# Patient Record
Sex: Male | Born: 1975 | Race: Black or African American | Hispanic: No | Marital: Married | Smoking: Never smoker
Health system: Southern US, Community
[De-identification: ages and names within clinical notes are randomized; demographics above are authoritative.]

---

## 2017-04-04 ENCOUNTER — Other Ambulatory Visit: Payer: Self-pay

## 2017-04-04 ENCOUNTER — Emergency Department (HOSPITAL_COMMUNITY)
Admission: EM | Admit: 2017-04-04 | Discharge: 2017-04-04 | Disposition: A | Discharge status: Home / Self Care | Payer: BLUE CROSS/BLUE SHIELD | Attending: Emergency Medicine | Admitting: Emergency Medicine

## 2017-04-04 ENCOUNTER — Emergency Department (HOSPITAL_COMMUNITY): Payer: BLUE CROSS/BLUE SHIELD

## 2017-04-04 ENCOUNTER — Encounter (HOSPITAL_COMMUNITY): Payer: Self-pay

## 2017-04-04 DIAGNOSIS — Z79899 Other long term (current) drug therapy: Secondary | ICD-10-CM | POA: Insufficient documentation

## 2017-04-04 DIAGNOSIS — H81393 Other peripheral vertigo, bilateral: Secondary | ICD-10-CM | POA: Insufficient documentation

## 2017-04-04 DIAGNOSIS — R42 Dizziness and giddiness: Secondary | ICD-10-CM | POA: Diagnosis present

## 2017-04-04 DIAGNOSIS — H81399 Other peripheral vertigo, unspecified ear: Secondary | ICD-10-CM

## 2017-04-04 LAB — CBC
HEMATOCRIT: 46.9 % (ref 39.0–52.0)
HEMOGLOBIN: 16.2 g/dL (ref 13.0–17.0)
MCH: 31.7 pg (ref 26.0–34.0)
MCHC: 34.5 g/dL (ref 30.0–36.0)
MCV: 91.8 fL (ref 78.0–100.0)
Platelets: 249 10*3/uL (ref 150–400)
RBC: 5.11 MIL/uL (ref 4.22–5.81)
RDW: 13.1 % (ref 11.5–15.5)
WBC: 3.9 10*3/uL — ABNORMAL LOW (ref 4.0–10.5)

## 2017-04-04 LAB — COMPREHENSIVE METABOLIC PANEL
ALBUMIN: 4.6 g/dL (ref 3.5–5.0)
ALK PHOS: 54 U/L (ref 38–126)
ALT: 36 U/L (ref 17–63)
ANION GAP: 8 (ref 5–15)
AST: 27 U/L (ref 15–41)
BILIRUBIN TOTAL: 1.2 mg/dL (ref 0.3–1.2)
BUN: 15 mg/dL (ref 6–20)
CALCIUM: 9.5 mg/dL (ref 8.9–10.3)
CO2: 28 mmol/L (ref 22–32)
Chloride: 101 mmol/L (ref 101–111)
Creatinine, Ser: 1.11 mg/dL (ref 0.61–1.24)
GFR calc Af Amer: >60 mL/min (ref >60)
GLUCOSE: 106 mg/dL — AB (ref 65–99)
Potassium: 3.8 mmol/L (ref 3.5–5.1)
Sodium: 137 mmol/L (ref 135–145)
TOTAL PROTEIN: 7.8 g/dL (ref 6.5–8.1)

## 2017-04-04 LAB — I-STAT TROPONIN, ED: TROPONIN I, POC: 0 ng/mL (ref 0.00–0.08)

## 2017-04-04 LAB — CBG MONITORING, ED: Glucose-Capillary: 91 mg/dL (ref 65–99)

## 2017-04-04 MED ORDER — ONDANSETRON 8 MG PO TBDP: 8.0000 mg | ORAL_TABLET | Freq: Three times a day (TID) | ORAL | 0 refills | Status: AC | PRN

## 2017-04-04 MED ORDER — LORAZEPAM 2 MG/ML IJ SOLN
0.5000 mg | Freq: Once | INTRAMUSCULAR | Status: AC
  Administered 2017-04-04: 0.5 mg via INTRAVENOUS
  Filled 2017-04-04: qty 1

## 2017-04-04 MED ORDER — MECLIZINE HCL 25 MG PO TABS: 25.0000 mg | ORAL_TABLET | Freq: Three times a day (TID) | ORAL | 0 refills | Status: AC | PRN

## 2017-04-04 NOTE — ED Notes (Signed)
Bed: WA14 Expected date:  Expected time:  Means of arrival:  Comments: Hold forRes B 

## 2017-04-04 NOTE — ED Triage Notes (Addendum)
Patient brought in by POV by wife. Patient wife states she found the patient on the floor at approx 1030 this AM with complaints of dizziness, hot flashes, and left sided weakness. Patient reports he lost his balance and fell. Patient also reports the dizziness was bad enough to cause him to vomit. [facility] and sent to ED for stroke work up Patient ambulated to triage, but continues to complain of dizziness.  EKG and CBG completed in triage. Patient had PRK Lasik surgery on 03/21/17. Patient complains of headache at this time.

## 2017-04-04 NOTE — ED Provider Notes (Signed)
[facility]-EMERGENCY DEPT Provider Note   CSN: [id number] Arrival date & time: 04/04/17  1311     History   Chief Complaint Chief Complaint  Patient presents with  . Dizziness    HPI Paul Walter is a 42 y.o. male.  HPI Patient is a 42 year old male presents the emergency department with acute onset dizziness described as room spinning with associated nausea vomiting.  Denies weakness of his arms or legs.  Family found him on the floor complaining of dizziness.  He was in his normal state of health earlier today.  He denies fevers and chills.  No recent head injury.  No chest pain or shortness of breath.  No palpitations.   History reviewed. No pertinent past medical history.  There are no active problems to display for this patient.   History reviewed. No pertinent surgical history.     Home Medications    Prior to Admission medications   Medication Sig Start Date End Date Taking? Authorizing Provider  albuterol (PROVENTIL HFA;VENTOLIN HFA) 108 (90 Base) MCG/ACT inhaler Inhale 2 puffs into the lungs every 6 (six) hours as needed for wheezing or shortness of breath.   Yes [provider]  prednisoLONE acetate (PRED FORTE) 1 % ophthalmic suspension Place 1 drop into both eyes 3 (three) times daily.   Yes [provider]    Family History History reviewed. No pertinent family history.  Social History Social History   Tobacco Use  . Smoking status: Never Smoker  . Smokeless tobacco: Never Used  Substance Use Topics  . Alcohol use: No    Frequency: Never  . Drug use: No     Allergies   Patient has no known allergies.   Review of Systems Review of Systems  All other systems reviewed and are negative.    Physical Exam Updated Vital Signs BP 95/65 (BP Location: Left Arm)   Pulse (!) 45   Temp 97.9 F (36.6 C) (Oral)   Resp 17   Ht 5\' 11"  (1.803 m)   Wt 108.4 kg (239 lb)   SpO2 90%   BMI 33.33 kg/m    Physical Exam  Constitutional: He is oriented to person, place, and time. He appears well-developed and well-nourished.  HENT:  Head: Normocephalic and atraumatic.  Eyes: EOM are normal. Pupils are equal, round, and reactive to light.  Neck: Normal range of motion.  Cardiovascular: Normal rate, regular rhythm, normal heart sounds and intact distal pulses.  Pulmonary/Chest: Effort normal and breath sounds normal. No respiratory distress.  Abdominal: Soft. He exhibits no distension. There is no tenderness.  Musculoskeletal: Normal range of motion.  Neurological: He is alert and oriented to person, place, and time.  5/5 strength in major muscle groups of  bilateral upper and lower extremities. Speech normal. No facial asymetry.   Skin: Skin is warm and dry.  Psychiatric: He has a normal mood and affect. Judgment normal.  Nursing note and vitals reviewed.    ED Treatments / Results  Labs (all labs ordered are listed, but only abnormal results are displayed) Labs Reviewed  CBC - Abnormal; Notable for the following components:      Result Value   WBC 3.9 (*)    All other components within normal limits  COMPREHENSIVE METABOLIC PANEL - Abnormal; Notable for the following components:   Glucose, Bld 106 (*)    All other components within normal limits  CBG MONITORING, ED  I-STAT TROPONIN, ED    EKG  EKG  Interpretation  Date/Time:  Saturday April 04 2017 13:31:49 EST Ventricular Rate:  65 PR Interval:    QRS Duration: 81 QT Interval:  381 QTC Calculation: 397 R Axis:   89 Text Interpretation:  Sinus rhythm Borderline T abnormalities, inferior leads No significant change was found Confirmed by Azalia Bilis ([id number]) on 04/04/2017 1:53:02 PM       Radiology Ct Head Code Stroke Wo Contrast  Result Date: 04/04/2017 CLINICAL DATA:  Code stroke. 42 y/o M; found on floor, dizziness, hot flashes, left-sided weakness. EXAM: CT HEAD WITHOUT CONTRAST TECHNIQUE: Contiguous axial  images were obtained from the base of the skull through the vertex without intravenous contrast. COMPARISON:  None. FINDINGS: Brain: No evidence of acute infarction, hemorrhage, hydrocephalus, extra-axial collection or mass lesion/mass effect. Vascular: No hyperdense vessel or unexpected calcification. Skull: Normal. Negative for fracture or focal lesion. Sinuses/Orbits: No acute finding. Other: None. ASPECTS [facility] Stroke Program Early CT Score) - Ganglionic level infarction (caudate, lentiform nuclei, internal capsule, insula, M1-M3 cortex): 7 - Supraganglionic infarction (M4-M6 cortex): 3 Total score (0-10 with 10 being normal): 10 IMPRESSION: 1. No acute intracranial abnormality identified. 2. ASPECTS is 10 These results were called by telephone at the time of interpretation on 04/04/2017 at 2:03 pm to Dr. Azalia Bilis , who verbally acknowledged these results. Electronically Signed   By: Mitzi Hansen M.D.   On: 04/04/2017 14:03    Procedures Procedures (including critical care time)  Medications Ordered in ED Medications  LORazepam (ATIVAN) injection 0.5 mg (0.5 mg Intravenous Given 04/04/17 1507)     Initial Impression / Assessment and Plan / ED Course  I have reviewed the triage vital signs and the nursing notes.  Pertinent labs & imaging results that were available during my care of the patient were reviewed by me and considered in my medical decision making (see chart for details).     Overall well-appearing.  Likely peripheral vertigo.  Given patient's ataxia earlier today he will undergo MRI to evaluate for central process.  MRI pending at this time.   Final Clinical Impressions(s) / ED Diagnoses     Final diagnoses:  None    ED Discharge Orders    None       Azalia Bilis, MD 04/04/17 1649

## 2018-09-12 ENCOUNTER — Other Ambulatory Visit: Payer: Self-pay | Admitting: *Deleted

## 2018-09-12 DIAGNOSIS — Z20822 Contact with and (suspected) exposure to covid-19: Secondary | ICD-10-CM

## 2018-09-16 NOTE — Addendum Note (Signed)
Addended by: Albirda Shiel M on: 09/16/2018 03:47 PM   Modules accepted: Orders  

## 2021-01-12 ENCOUNTER — Other Ambulatory Visit (HOSPITAL_COMMUNITY): Payer: Self-pay

## 2021-01-12 ENCOUNTER — Emergency Department (HOSPITAL_COMMUNITY)
Admission: EM | Admit: 2021-01-12 | Discharge: 2021-01-12 | Disposition: A | Discharge status: Home / Self Care | Payer: No Typology Code available for payment source | Attending: Emergency Medicine | Admitting: Emergency Medicine

## 2021-01-12 ENCOUNTER — Other Ambulatory Visit: Payer: Self-pay

## 2021-01-12 ENCOUNTER — Emergency Department (HOSPITAL_COMMUNITY): Payer: No Typology Code available for payment source

## 2021-01-12 ENCOUNTER — Encounter (HOSPITAL_COMMUNITY): Payer: Self-pay | Admitting: Emergency Medicine

## 2021-01-12 DIAGNOSIS — Y92410 Unspecified street and highway as the place of occurrence of the external cause: Secondary | ICD-10-CM | POA: Insufficient documentation

## 2021-01-12 DIAGNOSIS — S161XXA Strain of muscle, fascia and tendon at neck level, initial encounter: Secondary | ICD-10-CM | POA: Diagnosis not present

## 2021-01-12 DIAGNOSIS — S199XXA Unspecified injury of neck, initial encounter: Secondary | ICD-10-CM | POA: Diagnosis present

## 2021-01-12 LAB — CBC WITH DIFFERENTIAL/PLATELET
Abs Immature Granulocytes: 0.01 10*3/uL (ref 0.00–0.07)
Basophils Absolute: 0 10*3/uL (ref 0.0–0.1)
Basophils Relative: 0 %
Eosinophils Absolute: 0.1 10*3/uL (ref 0.0–0.5)
Eosinophils Relative: 2 %
HCT: 48.4 % (ref 39.0–52.0)
Hemoglobin: 16.2 g/dL (ref 13.0–17.0)
Immature Granulocytes: 0 %
Lymphocytes Relative: 53 %
Lymphs Abs: 2.4 10*3/uL (ref 0.7–4.0)
MCH: 31.5 pg (ref 26.0–34.0)
MCHC: 33.5 g/dL (ref 30.0–36.0)
MCV: 94 fL (ref 80.0–100.0)
Monocytes Absolute: 0.4 10*3/uL (ref 0.1–1.0)
Monocytes Relative: 8 %
Neutro Abs: 1.7 10*3/uL (ref 1.7–7.7)
Neutrophils Relative %: 37 %
Platelets: 225 10*3/uL (ref 150–400)
RBC: 5.15 MIL/uL (ref 4.22–5.81)
RDW: 13.1 % (ref 11.5–15.5)
WBC: 4.6 10*3/uL (ref 4.0–10.5)
nRBC: 0 % (ref 0.0–0.2)

## 2021-01-12 LAB — BASIC METABOLIC PANEL
Anion gap: 7 (ref 5–15)
BUN: 12 mg/dL (ref 6–20)
CO2: 28 mmol/L (ref 22–32)
Calcium: 9.6 mg/dL (ref 8.9–10.3)
Chloride: 103 mmol/L (ref 98–111)
Creatinine, Ser: 1.04 mg/dL (ref 0.61–1.24)
GFR, Estimated: >60 mL/min (ref >60)
Glucose, Bld: 119 mg/dL — ABNORMAL HIGH (ref 70–99)
Potassium: 4.3 mmol/L (ref 3.5–5.1)
Sodium: 138 mmol/L (ref 135–145)
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
## 2021-01-12 NOTE — Discharge Instructions (Signed)
Please use ibuprofen as needed for pain for the next 48 to 72 hours.  Drink plenty of fluids and use gentle movement as discussed.  Return if you are having worsening symptoms, symptoms do not resolve, or you have new symptoms please seek reevaluation at that time
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
## 2021-04-16 DIAGNOSIS — Z630 Problems in relationship with spouse or partner: Secondary | ICD-10-CM | POA: Diagnosis not present

## 2021-04-16 DIAGNOSIS — F320 Major depressive disorder, single episode, mild: Secondary | ICD-10-CM | POA: Diagnosis not present

## 2021-04-16 DIAGNOSIS — R69 Illness, unspecified: Secondary | ICD-10-CM | POA: Diagnosis not present

## 2021-10-01 DIAGNOSIS — R059 Cough, unspecified: Secondary | ICD-10-CM | POA: Diagnosis not present

## 2021-10-01 DIAGNOSIS — R062 Wheezing: Secondary | ICD-10-CM | POA: Diagnosis not present

## 2021-10-01 DIAGNOSIS — R0982 Postnasal drip: Secondary | ICD-10-CM | POA: Diagnosis not present

## 2021-10-01 DIAGNOSIS — J069 Acute upper respiratory infection, unspecified: Secondary | ICD-10-CM | POA: Diagnosis not present

## 2022-02-21 DIAGNOSIS — J029 Acute pharyngitis, unspecified: Secondary | ICD-10-CM | POA: Diagnosis not present

## 2022-02-21 DIAGNOSIS — J209 Acute bronchitis, unspecified: Secondary | ICD-10-CM | POA: Diagnosis not present

## 2022-02-21 DIAGNOSIS — H6502 Acute serous otitis media, left ear: Secondary | ICD-10-CM | POA: Diagnosis not present
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
